# Patient Record
Sex: Male | Born: 2000 | Race: White | Hispanic: No | Marital: Single | Smoking: Never smoker
Health system: Southern US, Community
[De-identification: ages and names within clinical notes are randomized; demographics above are authoritative.]

---
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
## 2020-02-10 DIAGNOSIS — Z20822 Contact with and (suspected) exposure to covid-19: Secondary | ICD-10-CM | POA: Diagnosis not present

## 2020-03-09 DIAGNOSIS — Z79899 Other long term (current) drug therapy: Secondary | ICD-10-CM | POA: Diagnosis not present

## 2020-03-09 DIAGNOSIS — F902 Attention-deficit hyperactivity disorder, combined type: Secondary | ICD-10-CM | POA: Diagnosis not present

## 2020-06-29 DIAGNOSIS — F902 Attention-deficit hyperactivity disorder, combined type: Secondary | ICD-10-CM | POA: Diagnosis not present

## 2020-06-29 DIAGNOSIS — Z79899 Other long term (current) drug therapy: Secondary | ICD-10-CM | POA: Diagnosis not present

## 2020-07-13 DIAGNOSIS — Z79899 Other long term (current) drug therapy: Secondary | ICD-10-CM | POA: Diagnosis not present

## 2020-09-06 DIAGNOSIS — H66002 Acute suppurative otitis media without spontaneous rupture of ear drum, left ear: Secondary | ICD-10-CM | POA: Diagnosis not present

## 2020-09-20 DIAGNOSIS — R059 Cough, unspecified: Secondary | ICD-10-CM | POA: Diagnosis not present

## 2020-09-20 DIAGNOSIS — H10021 Other mucopurulent conjunctivitis, right eye: Secondary | ICD-10-CM | POA: Diagnosis not present

## 2020-09-20 DIAGNOSIS — Z23 Encounter for immunization: Secondary | ICD-10-CM | POA: Diagnosis not present

## 2020-09-20 DIAGNOSIS — J00 Acute nasopharyngitis [common cold]: Secondary | ICD-10-CM | POA: Diagnosis not present

## 2020-10-13 DIAGNOSIS — J029 Acute pharyngitis, unspecified: Secondary | ICD-10-CM | POA: Diagnosis not present

## 2020-10-13 DIAGNOSIS — Z03818 Encounter for observation for suspected exposure to other biological agents ruled out: Secondary | ICD-10-CM | POA: Diagnosis not present

## 2020-10-13 DIAGNOSIS — Z20822 Contact with and (suspected) exposure to covid-19: Secondary | ICD-10-CM | POA: Diagnosis not present

## 2020-10-13 DIAGNOSIS — J039 Acute tonsillitis, unspecified: Secondary | ICD-10-CM | POA: Diagnosis not present

## 2020-10-31 DIAGNOSIS — Z03818 Encounter for observation for suspected exposure to other biological agents ruled out: Secondary | ICD-10-CM | POA: Diagnosis not present

## 2020-10-31 DIAGNOSIS — Z20822 Contact with and (suspected) exposure to covid-19: Secondary | ICD-10-CM | POA: Diagnosis not present

## 2020-10-31 DIAGNOSIS — R509 Fever, unspecified: Secondary | ICD-10-CM | POA: Diagnosis not present

## 2020-10-31 DIAGNOSIS — J1189 Influenza due to unidentified influenza virus with other manifestations: Secondary | ICD-10-CM | POA: Diagnosis not present

## 2020-10-31 DIAGNOSIS — R059 Cough, unspecified: Secondary | ICD-10-CM | POA: Diagnosis not present

## 2020-11-03 DIAGNOSIS — R059 Cough, unspecified: Secondary | ICD-10-CM | POA: Diagnosis not present

## 2020-11-03 DIAGNOSIS — J209 Acute bronchitis, unspecified: Secondary | ICD-10-CM | POA: Diagnosis not present

## 2021-08-10 ENCOUNTER — Ambulatory Visit: Payer: Self-pay | Admitting: Allergy and Immunology

## 2023-10-25 ENCOUNTER — Ambulatory Visit (HOSPITAL_BASED_OUTPATIENT_CLINIC_OR_DEPARTMENT_OTHER): Payer: 59 | Admitting: Student

## 2023-10-25 ENCOUNTER — Ambulatory Visit (HOSPITAL_BASED_OUTPATIENT_CLINIC_OR_DEPARTMENT_OTHER): Payer: 59

## 2023-10-25 ENCOUNTER — Encounter (HOSPITAL_BASED_OUTPATIENT_CLINIC_OR_DEPARTMENT_OTHER): Payer: Self-pay | Admitting: Student

## 2023-10-25 DIAGNOSIS — M79671 Pain in right foot: Secondary | ICD-10-CM

## 2023-10-25 NOTE — Progress Notes (Unsigned)
Chief Complaint: Right foot pain     History of Present Illness:    Julian Castillo is a 22 y.o. male presenting today with pain in his right foot.  He states he was at the gym on a straight leg raise machine when his foot slipped and subsequently dorsiflexed.  Does not recall feeling a pop.  He did have some difficulty weightbearing yesterday although he is able to tolerate this much better today.  Pain is located mainly on the top and lateral sides of the foot just below the ankle.  Denies any numbness or tingling.  He has taken Advil and ice to the area.  No previous foot or ankle injuries.  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  Surgical History:   None  PMH/PSH/Family History/Social History/Meds/Allergies:   History reviewed. No pertinent past medical history. History reviewed. No pertinent surgical history. Social History   Socioeconomic History   Marital status: Single    Spouse name: Not on file   Number of children: Not on file   Years of education: Not on file   Highest education level: Not on file  Occupational History   Not on file  Tobacco Use   Smoking status: Never   Smokeless tobacco: Not on file  Substance and Sexual Activity   Alcohol use: No   Drug use: Not on file   Sexual activity: Not on file  Other Topics Concern   Not on file  Social History Narrative   Not on file   Social Determinants of Health   Financial Resource Strain: Not on file  Food Insecurity: Not on file  Transportation Needs: Not on file  Physical Activity: Not on file  Stress: Not on file  Social Connections: Not on file   History reviewed. No pertinent family history. Allergies  Allergen Reactions   Other Rash    Tree nuts   Current Outpatient Medications  Medication Sig Dispense Refill   EPIPEN 2-PAK 0.3 MG/0.3ML SOAJ injection Inject 0.3 mLs into the skin as needed. once for allergic reaction     No current  facility-administered medications for this visit.   No results found.  Review of Systems:   A ROS was performed including pertinent positives and negatives as documented in the HPI.  Physical Exam :   Constitutional: NAD and appears stated age Neurological: Alert and oriented Psych: Appropriate affect and cooperative There were no vitals taken for this visit.   Comprehensive Musculoskeletal Exam:    Mild swelling noted of the lateral right foot with mild tenderness over the cuboid.  No evidence of erythema or ecchymosis.  No tenderness over the lateral malleolus, lateral ankle ligaments, calcaneus, or fifth metatarsal base.  Slight discomfort noted with ankle dorsiflexion although ankle ROM is full with both plantarflexion and dorsiflexion.  Negative anterior drawer and Thompson test.  Imaging:   Xray (right foot 3 views): Negative for acute fracture or dislocation    I personally reviewed and interpreted the radiographs.   Assessment:   22 y.o. male with lateral right foot pain after an injury sustained at the gym yesterday.  X-rays do not appear to show any evidence of fracture or dislocation.  He has improved significantly yesterday which is reassuring.  This could be reflective of peroneal tendon aggravation but  low suspicion for a tear.  Given his improvement I have recommended continuing with anti-inflammatories and RICE therapy.  He can proceed with weightbearing and subsequently return to exercise as tolerated.  Discussed that should symptoms not continue to improve or worsen, he can follow-up for further evaluation.  Patient was agreeable to plan.  Plan :    -Return to clinic as needed     I personally saw and evaluated the patient, and participated in the management and treatment plan.  Hazle Nordmann, PA-C Orthopedics

## 2023-10-26 ENCOUNTER — Ambulatory Visit (HOSPITAL_BASED_OUTPATIENT_CLINIC_OR_DEPARTMENT_OTHER): Payer: Self-pay | Admitting: Student

## 2023-11-07 ENCOUNTER — Ambulatory Visit: Payer: Self-pay | Admitting: Allergy and Immunology

## 2023-12-19 ENCOUNTER — Ambulatory Visit: Payer: Self-pay | Admitting: Allergy and Immunology
